# Patient Record
Sex: Male | Born: 1985 | Race: Black or African American | Hispanic: No | Marital: Single | Smoking: Never smoker
Health system: Southern US, Community
[De-identification: ages and names within clinical notes are randomized; demographics above are authoritative.]

## PROBLEM LIST (undated history)

## (undated) DIAGNOSIS — M549 Dorsalgia, unspecified: Secondary | ICD-10-CM

---

## 2013-11-23 ENCOUNTER — Ambulatory Visit
Admission: RE | Admit: 2013-11-23 | Discharge: 2013-11-23 | Disposition: A | Discharge status: Home / Self Care | Payer: No Typology Code available for payment source | Source: Ambulatory Visit | Attending: Occupational Medicine | Admitting: Occupational Medicine

## 2013-11-23 ENCOUNTER — Other Ambulatory Visit: Payer: Self-pay | Admitting: Occupational Medicine

## 2013-11-23 DIAGNOSIS — Z021 Encounter for pre-employment examination: Secondary | ICD-10-CM

## 2014-08-26 ENCOUNTER — Encounter (HOSPITAL_COMMUNITY): Payer: Self-pay

## 2015-02-22 IMAGING — CR DG CHEST 1V
1 series · 1 of 1 positions shown · non-contrast
Comparison: None.

CLINICAL DATA: Pre-employment physical

EXAM:
CHEST - 1 VIEW

[view not recorded]
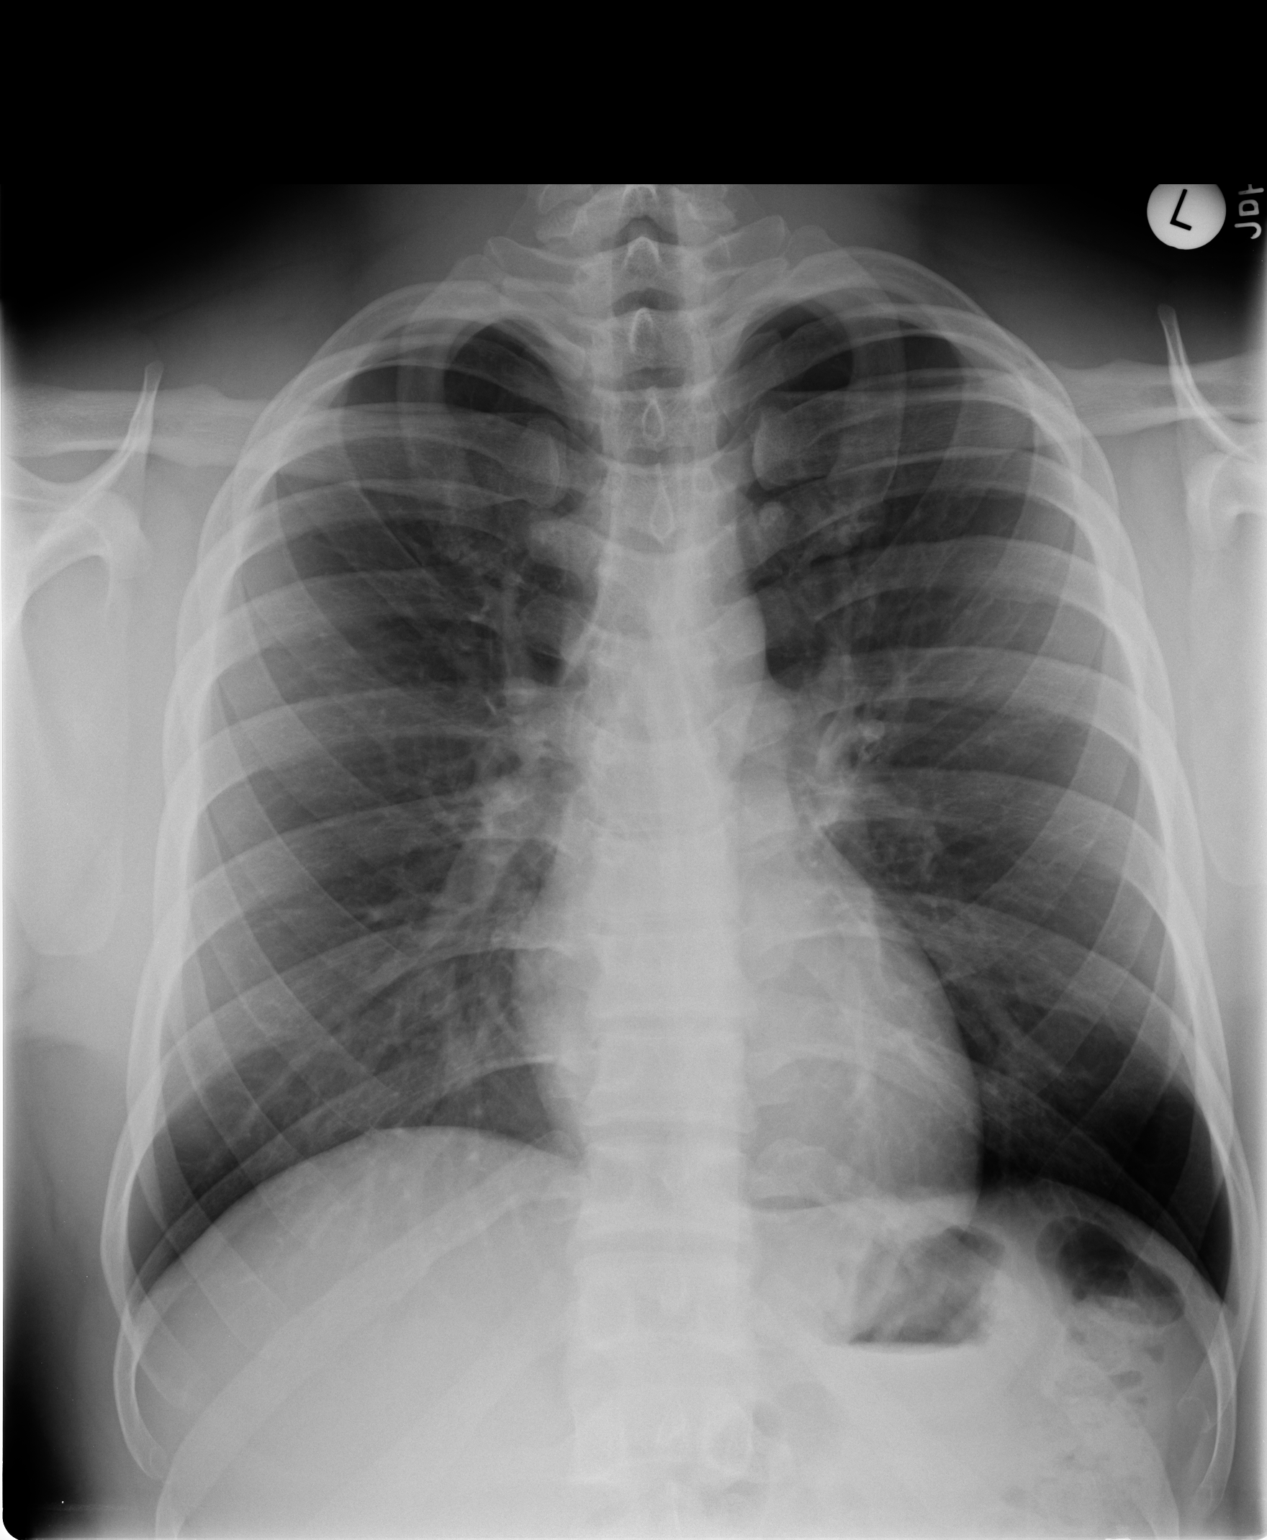

[1 of 1 positions shown; findings below may reference images not displayed]

FINDINGS: Lungs are clear. No pleural effusion or pneumothorax.

The heart is normal in size.
IMPRESSION: No evidence of acute cardiopulmonary disease.

## 2016-11-05 ENCOUNTER — Emergency Department (HOSPITAL_BASED_OUTPATIENT_CLINIC_OR_DEPARTMENT_OTHER)
Admission: EM | Admit: 2016-11-05 | Discharge: 2016-11-05 | Disposition: A | Discharge status: Home / Self Care | Payer: Non-veteran care | Attending: Emergency Medicine | Admitting: Emergency Medicine

## 2016-11-05 ENCOUNTER — Encounter (HOSPITAL_BASED_OUTPATIENT_CLINIC_OR_DEPARTMENT_OTHER): Payer: Self-pay

## 2016-11-05 DIAGNOSIS — M545 Low back pain, unspecified: Secondary | ICD-10-CM

## 2016-11-05 DIAGNOSIS — M549 Dorsalgia, unspecified: Secondary | ICD-10-CM | POA: Diagnosis present

## 2016-11-05 HISTORY — DX: Dorsalgia, unspecified: M54.9

## 2016-11-05 MED ORDER — IBUPROFEN 800 MG PO TABS
800.0000 mg | ORAL_TABLET | Freq: Once | ORAL | Status: AC
  Administered 2016-11-05: 800 mg via ORAL
  Filled 2016-11-05: qty 1

## 2016-11-05 MED ORDER — ACETAMINOPHEN 500 MG PO TABS
1000.0000 mg | ORAL_TABLET | Freq: Once | ORAL | Status: AC
  Administered 2016-11-05: 1000 mg via ORAL
  Filled 2016-11-05: qty 2

## 2016-11-05 MED ORDER — OXYCODONE HCL 5 MG PO TABS: 5.0000 mg | ORAL_TABLET | ORAL | 0 refills | Status: AC | PRN

## 2016-11-05 MED ORDER — DIAZEPAM 5 MG PO TABS: 5.0000 mg | ORAL_TABLET | Freq: Four times a day (QID) | ORAL | 0 refills | Status: AC | PRN

## 2016-11-05 MED ORDER — DIAZEPAM 5 MG PO TABS: 5.0000 mg | ORAL_TABLET | Freq: Once | ORAL | Status: DC

## 2016-11-05 MED ORDER — KETOROLAC TROMETHAMINE 60 MG/2ML IM SOLN
60.0000 mg | Freq: Once | INTRAMUSCULAR | Status: AC
  Administered 2016-11-05: 60 mg via INTRAMUSCULAR
  Filled 2016-11-05: qty 2

## 2016-11-05 NOTE — Discharge Instructions (Signed)
Take 4 over the counter ibuprofen tablets 3 times a day or 2 over-the-counter naproxen tablets twice a day for pain. Also take tylenol 1000mg(2 extra strength) four times a day.    

## 2016-11-05 NOTE — ED Provider Notes (Signed)
[facility]   CSN: [id number] Arrival date & time: 11/05/16  1601     History   Chief Complaint Chief Complaint  Patient presents with  . Back Pain    HPI Anthony Wilkins is a 31 y.o. male.  31 yo M with a chief complaints of low back pain. This been a chronic issue for him. Going on for the past couple years. Worsening since yesterday. Denies specific injury yesterday. Denies loss of bowel or bladder. Denies loss of perirectal sensation. Denies weakness or numbness to the legs. Denies radiation of the pain. Worse with movement palpation twisting.   The history is provided by the patient.  Back Pain   This is a chronic problem. The current episode started more than 1 week ago. The problem occurs constantly. The problem has not changed since onset.The pain is associated with no known injury. The pain is present in the lumbar spine. The quality of the pain is described as stabbing. The pain does not radiate. The pain is at a severity of 7/10. The pain is moderate. The symptoms are aggravated by bending, twisting and certain positions. Pertinent negatives include no chest pain, no fever, no headaches and no abdominal pain. He has tried nothing for the symptoms. The treatment provided no relief.    Past Medical History:  Diagnosis Date  . Back pain     There are no active problems to display for this patient.   History reviewed. No pertinent surgical history.     Home Medications    Prior to Admission medications   Medication Sig Start Date End Date Taking? Authorizing Provider  diazepam (VALIUM) 5 MG tablet Take 1 tablet (5 mg total) by mouth every 6 (six) hours as needed for anxiety (spasms). 11/05/16   Melene Plan, DO  oxyCODONE (ROXICODONE) 5 MG immediate release tablet Take 1 tablet (5 mg total) by mouth every 4 (four) hours as needed for severe pain. 11/05/16   Melene Plan, DO    Family History No family history on file.  Social History Social  History  Substance Use Topics  . Smoking status: Never Smoker  . Smokeless tobacco: Never Used  . Alcohol use No     Allergies   Patient has no known allergies.   Review of Systems Review of Systems  Constitutional: Negative for chills and fever.  HENT: Negative for congestion and facial swelling.   Eyes: Negative for discharge and visual disturbance.  Respiratory: Negative for shortness of breath.   Cardiovascular: Negative for chest pain and palpitations.  Gastrointestinal: Negative for abdominal pain, diarrhea and vomiting.  Musculoskeletal: Positive for back pain. Negative for arthralgias and myalgias.  Skin: Negative for color change and rash.  Neurological: Negative for tremors, syncope and headaches.  Psychiatric/Behavioral: Negative for confusion and dysphoric mood.     Physical Exam Updated Vital Signs BP 147/79 (BP Location: Right Arm)   Pulse 70   Temp 98 F (36.7 C) (Oral)   Resp 16   Ht 5\' 11"  (1.803 m)   Wt 231 lb (104.8 kg)   SpO2 98%   BMI 32.22 kg/m   Physical Exam  Constitutional: He is oriented to person, place, and time. He appears well-developed and well-nourished.  HENT:  Head: Normocephalic and atraumatic.  Eyes: EOM are normal. Pupils are equal, round, and reactive to light.  Neck: Normal range of motion. Neck supple. No JVD present.  Cardiovascular: Normal rate and regular rhythm.  Exam reveals no gallop and  no friction rub.   No murmur heard. Pulmonary/Chest: No respiratory distress. He has no wheezes.  Abdominal: He exhibits no distension. There is no rebound and no guarding.  Musculoskeletal: Normal range of motion. He exhibits tenderness (low back along L 4 bilaterally without midline tenderness. ).  Ambulatory. Pulse motor and sensation intact distally. No clonus. Reflexes 2+ and equal bilaterally.    Neurological: He is alert and oriented to person, place, and time.  Skin: No rash noted. No pallor.  Psychiatric: He has a normal  mood and affect. His behavior is normal.  Nursing note and vitals reviewed.    ED Treatments / Results  Labs (all labs ordered are listed, but only abnormal results are displayed) Labs Reviewed - No data to display  EKG  EKG Interpretation None       Radiology No results found.  Procedures Procedures (including critical care time)  Medications Ordered in ED Medications  ketorolac (TORADOL) injection 60 mg (not administered)  acetaminophen (TYLENOL) tablet 1,000 mg (not administered)  ibuprofen (ADVIL,MOTRIN) tablet 800 mg (not administered)     Initial Impression / Assessment and Plan / ED Course  I have reviewed the triage vital signs and the nursing notes.  Pertinent labs & imaging results that were available during my care of the patient were reviewed by me and considered in my medical decision making (see chart for details).  Clinical Course     31 yo M With a chief complaints of low back pain. No red flags. Upon discussion with the patient he then disclosed that he has been being treated for this for 2 years with physical therapy as well as seen a spinal surgeon. Discharge home.  4:49 PM:  I have discussed the diagnosis/risks/treatment options with the patient and believe the pt to be eligible for discharge home to follow-up with PCP. We also discussed returning to the ED immediately if new or worsening sx occur. We discussed the sx which are most concerning (e.g., sudden worsening pain, fever, inability to tolerate by mouth) that necessitate immediate return. Medications administered to the patient during their visit and any new prescriptions provided to the patient are listed below.  Medications given during this visit Medications  ketorolac (TORADOL) injection 60 mg (60 mg Intramuscular Given 11/05/16 1648)  acetaminophen (TYLENOL) tablet 1,000 mg (1,000 mg Oral Given 11/05/16 1647)  ibuprofen (ADVIL,MOTRIN) tablet 800 mg (800 mg Oral Given 11/05/16 1647)     The  patient appears reasonably screen and/or stabilized for discharge and I doubt any other medical condition or other [facility] requiring further screening, evaluation, or treatment in the ED at this time prior to discharge.    Final Clinical Impressions(s) / ED Diagnoses   Final diagnoses:  Acute bilateral low back pain without sciatica    New Prescriptions New Prescriptions   DIAZEPAM (VALIUM) 5 MG TABLET    Take 1 tablet (5 mg total) by mouth every 6 (six) hours as needed for anxiety (spasms).   OXYCODONE (ROXICODONE) 5 MG IMMEDIATE RELEASE TABLET    Take 1 tablet (5 mg total) by mouth every 4 (four) hours as needed for severe pain.     Melene Planan Kalman Nylen, DO 11/05/16 1649

## 2016-11-05 NOTE — ED Triage Notes (Signed)
C/o lower back pain since last nigjht-denies new injury-injury in 2015 in the miliary-NAD-steady gait

## 2016-11-27 ENCOUNTER — Encounter (HOSPITAL_BASED_OUTPATIENT_CLINIC_OR_DEPARTMENT_OTHER): Payer: Self-pay

## 2016-11-27 ENCOUNTER — Emergency Department (HOSPITAL_BASED_OUTPATIENT_CLINIC_OR_DEPARTMENT_OTHER)
Admission: EM | Admit: 2016-11-27 | Discharge: 2016-11-27 | Disposition: A | Discharge status: Home / Self Care | Payer: Non-veteran care | Attending: Emergency Medicine | Admitting: Emergency Medicine

## 2016-11-27 DIAGNOSIS — Z79899 Other long term (current) drug therapy: Secondary | ICD-10-CM | POA: Insufficient documentation

## 2016-11-27 DIAGNOSIS — B9789 Other viral agents as the cause of diseases classified elsewhere: Secondary | ICD-10-CM

## 2016-11-27 DIAGNOSIS — J069 Acute upper respiratory infection, unspecified: Secondary | ICD-10-CM | POA: Insufficient documentation

## 2016-11-27 DIAGNOSIS — R05 Cough: Secondary | ICD-10-CM | POA: Diagnosis present

## 2016-11-27 LAB — RAPID STREP SCREEN (MED CTR MEBANE ONLY): Streptococcus, Group A Screen (Direct): NEGATIVE

## 2016-11-27 MED ORDER — BENZONATATE 100 MG PO CAPS: 100.0000 mg | ORAL_CAPSULE | Freq: Three times a day (TID) | ORAL | 0 refills | Status: AC

## 2016-11-27 NOTE — ED Provider Notes (Signed)
[facility]   CSN: [id number] Arrival date & time: 11/27/16  1616  By signing my name below, I, Alyssa GroveMartin Green, attest that this documentation has been prepared under the direction and in the presence of Renne CriglerJoshua Yousuf Ager, PA-C. Electronically Signed: Alyssa GroveMartin Green, ED Scribe. 11/27/16. 5:36 PM.   History   Chief Complaint Chief Complaint  Patient presents with  . Cough   The history is provided by the patient. No language interpreter was used.  Cough  Associated symptoms include chills, headaches and sore throat. Pertinent negatives include no ear pain, no rhinorrhea, no myalgias, no wheezing and no eye redness.   HPI Comments: Anthony FreshMamadou Knight is a 31 y.o. male who presents to the Emergency Department complaining of a gradual onset, persistent, unchanged non-productive cough beginning yesterday. He reports associated sore throat, diaphoresis, chills, chest tightness, mild nasal congestion and headache. Sore throat is exacerbated with eating. He has tried DayQuil with no significant relief to symptoms. Pt denies known sick contact. He has received his Influenza vaccination this year. Denies ear pain.  Past Medical History:  Diagnosis Date  . Back pain    There are no active problems to display for this patient.  History reviewed. No pertinent surgical history.   Home Medications    Prior to Admission medications   Medication Sig Start Date End Date Taking? Authorizing Provider  diazepam (VALIUM) 5 MG tablet Take 1 tablet (5 mg total) by mouth every 6 (six) hours as needed for anxiety (spasms). 11/05/16   Melene Planan Floyd, DO  oxyCODONE (ROXICODONE) 5 MG immediate release tablet Take 1 tablet (5 mg total) by mouth every 4 (four) hours as needed for severe pain. 11/05/16   Melene Planan Floyd, DO   Family History No family history on file.  Social History Social History  Substance Use Topics  . Smoking status: Never Smoker  . Smokeless tobacco: Never Used  . Alcohol use No     Allergies   Patient has no known allergies.  Review of Systems Review of Systems  Constitutional: Positive for chills and diaphoresis. Negative for fatigue and fever.  HENT: Positive for congestion and sore throat. Negative for ear pain, rhinorrhea and sinus pressure.   Eyes: Negative for redness.  Respiratory: Positive for cough and chest tightness. Negative for wheezing.   Gastrointestinal: Negative for abdominal pain, diarrhea, nausea and vomiting.  Genitourinary: Negative for dysuria.  Musculoskeletal: Negative for myalgias and neck stiffness.  Skin: Negative for rash.  Neurological: Positive for headaches.  Hematological: Negative for adenopathy.   Physical Exam Updated Vital Signs BP 165/81 (BP Location: Left Arm)   Pulse 76   Temp 100.4 F (38 C) (Oral)   Resp 18   Ht 5\' 11"  (1.803 m)   Wt 225 lb (102.1 kg)   SpO2 100%   BMI 31.38 kg/m   Physical Exam  Constitutional: He appears well-developed and well-nourished.  HENT:  Head: Normocephalic and atraumatic.  Right Ear: Tympanic membrane, external ear and ear canal normal.  Left Ear: Tympanic membrane, external ear and ear canal normal.  Nose: Mucosal edema present. No rhinorrhea.  Mouth/Throat: Uvula is midline and mucous membranes are normal. Mucous membranes are not dry. No trismus in the jaw. No uvula swelling. Posterior oropharyngeal erythema present. No oropharyngeal exudate, posterior oropharyngeal edema or tonsillar abscesses.  Eyes: Conjunctivae are normal. Right eye exhibits no discharge. Left eye exhibits no discharge.  Neck: Normal range of motion. Neck supple.  Cardiovascular: Normal rate, regular rhythm and normal  heart sounds.   Pulmonary/Chest: Effort normal and breath sounds normal. No respiratory distress. He has no wheezes. He has no rales.  Abdominal: Soft. There is no tenderness.  Neurological: He is alert.  Skin: Skin is warm and dry.  Psychiatric: He has a normal mood and affect.  Nursing  note and vitals reviewed.   ED Treatments / Results  DIAGNOSTIC STUDIES: Oxygen Saturation is 100% on RA, normal by my interpretation.    COORDINATION OF CARE: 5:34 PM Discussed treatment plan with pt at bedside which includes Rapid strep test and pt agreed to plan.  Labs (all labs ordered are listed, but only abnormal results are displayed) Labs Reviewed  RAPID STREP SCREEN (NOT AT [facility])  CULTURE, GROUP A STREP [facility])    Procedures Procedures (including critical care time)  Medications Ordered in ED Medications - No data to display   Initial Impression / Assessment and Plan / ED Course  I have reviewed the triage vital signs and the nursing notes.  Pertinent labs & imaging results that were available during my care of the patient were reviewed by me and considered in my medical decision making (see chart for details).     Vital signs reviewed and are as follows: Vitals:   11/27/16 1625 11/27/16 1723  BP: 165/81   Pulse: 76   Resp: 18   Temp: 99.5 F (37.5 C) 100.4 F (38 C)    6:36 PM patient informed of negative strep test. Patient counseled on supportive care for viral URI and s/s to return including worsening symptoms, persistent fever, persistent vomiting, or if they have any other concerns. Urged to see PCP if symptoms persist for more than 3 days. Patient verbalizes understanding and agrees with plan.    Final Clinical Impressions(s) / ED Diagnoses   Final diagnoses:  Viral URI with cough   Patient with symptoms consistent with a viral syndromeVersus influenza. Vitals are stable, low-grade fever. No signs of dehydration. Strep negative. No signs of peritonsillar abscess. Patient handling secretions. Lung exam normal, doubt pneumonia. Supportive therapy indicated with return if symptoms worsen.     New Prescriptions New Prescriptions   BENZONATATE (TESSALON) 100 MG CAPSULE    Take 1 capsule (100 mg total) by mouth every 8 (eight) hours.     Renne Crigler, PA-C 11/27/16 1837    Benjiman Core, MD 11/28/16 [phone]

## 2016-11-27 NOTE — Discharge Instructions (Signed)
Please read and follow all provided instructions.  Your diagnoses today include:  1. Viral URI with cough     You appear to have an upper respiratory infection (URI). An upper respiratory tract infection, or cold, is a viral infection of the air passages leading to the lungs. It should improve gradually after 5-7 days. You may have a lingering cough that lasts for 2- 4 weeks after the infection.  Tests performed today include:  Vital signs. See below for your results today.   Medications prescribed:   Tessalon Perles - cough suppressant medication  Take any prescribed medications only as directed. Treatment for your infection is aimed at treating the symptoms. There are no medications, such as antibiotics, that will cure your infection.   Home care instructions:  Follow any educational materials contained in this packet.   Your illness is contagious and can be spread to others, especially during the first 3 or 4 days. It cannot be cured by antibiotics or other medicines. Take basic precautions such as washing your hands often, covering your mouth when you cough or sneeze, and avoiding public places where you could spread your illness to others.   Please continue drinking plenty of fluids.  Use over-the-counter medicines as needed as directed on packaging for symptom relief.  You may also use ibuprofen or tylenol as directed on packaging for pain or fever.  Do not take multiple medicines containing Tylenol or acetaminophen to avoid taking too much of this medication.  Follow-up instructions: Please follow-up with your primary care provider in the next 3 days for further evaluation of your symptoms if you are not feeling better.   Return instructions:   Please return to the Emergency Department if you experience worsening symptoms.   RETURN IMMEDIATELY IF you develop shortness of breath, confusion or altered mental status, a new rash, become dizzy, faint, or poorly responsive, or are  unable to be cared for at home.  Please return if you have persistent vomiting and cannot keep down fluids or develop a fever that is not controlled by tylenol or motrin.    Please return if you have any other emergent concerns.  Additional Information:  Your vital signs today were: BP 165/81 (BP Location: Left Arm)    Pulse 76    Temp 100.4 F (38 C) (Oral)    Resp 18    Ht 5\' 11"  (1.803 m)    Wt 102.1 kg    SpO2 100%    BMI 31.38 kg/m  If your blood pressure (BP) was elevated above 135/85 this visit, please have this repeated by your doctor within one month. --------------

## 2016-11-27 NOTE — ED Notes (Signed)
ED Provider at bedside. 

## 2016-11-27 NOTE — ED Triage Notes (Signed)
C/o cough x 2 days-NAD-steady gait 

## 2016-11-30 LAB — CULTURE, GROUP A STREP (THRC)
